# Patient Record
Sex: Male | Born: 1940 | Race: White | Hispanic: No | Marital: Married
Health system: Southern US, Community
[De-identification: ages and names within clinical notes are randomized; demographics above are authoritative.]

---

## 1997-12-05 ENCOUNTER — Ambulatory Visit (HOSPITAL_BASED_OUTPATIENT_CLINIC_OR_DEPARTMENT_OTHER): Admission: RE | Admit: 1997-12-05 | Discharge: 1997-12-05 | Payer: Self-pay | Admitting: Surgery

## 2012-01-04 DIAGNOSIS — Z1331 Encounter for screening for depression: Secondary | ICD-10-CM | POA: Diagnosis not present

## 2012-01-04 DIAGNOSIS — E7800 Pure hypercholesterolemia, unspecified: Secondary | ICD-10-CM | POA: Diagnosis not present

## 2012-01-04 DIAGNOSIS — I10 Essential (primary) hypertension: Secondary | ICD-10-CM | POA: Diagnosis not present

## 2012-05-22 DIAGNOSIS — Z23 Encounter for immunization: Secondary | ICD-10-CM | POA: Diagnosis not present

## 2012-07-17 DIAGNOSIS — H2510 Age-related nuclear cataract, unspecified eye: Secondary | ICD-10-CM | POA: Diagnosis not present

## 2012-07-17 DIAGNOSIS — H04129 Dry eye syndrome of unspecified lacrimal gland: Secondary | ICD-10-CM | POA: Diagnosis not present

## 2012-07-17 DIAGNOSIS — H43819 Vitreous degeneration, unspecified eye: Secondary | ICD-10-CM | POA: Diagnosis not present

## 2012-07-17 DIAGNOSIS — H52209 Unspecified astigmatism, unspecified eye: Secondary | ICD-10-CM | POA: Diagnosis not present

## 2012-12-19 DIAGNOSIS — E7800 Pure hypercholesterolemia, unspecified: Secondary | ICD-10-CM | POA: Diagnosis not present

## 2012-12-19 DIAGNOSIS — Z1211 Encounter for screening for malignant neoplasm of colon: Secondary | ICD-10-CM | POA: Diagnosis not present

## 2012-12-19 DIAGNOSIS — I10 Essential (primary) hypertension: Secondary | ICD-10-CM | POA: Diagnosis not present

## 2013-12-25 DIAGNOSIS — I10 Essential (primary) hypertension: Secondary | ICD-10-CM | POA: Diagnosis not present

## 2013-12-25 DIAGNOSIS — Z1331 Encounter for screening for depression: Secondary | ICD-10-CM | POA: Diagnosis not present

## 2013-12-25 DIAGNOSIS — Z23 Encounter for immunization: Secondary | ICD-10-CM | POA: Diagnosis not present

## 2013-12-25 DIAGNOSIS — E7800 Pure hypercholesterolemia, unspecified: Secondary | ICD-10-CM | POA: Diagnosis not present

## 2014-02-24 DIAGNOSIS — K047 Periapical abscess without sinus: Secondary | ICD-10-CM | POA: Diagnosis not present

## 2014-02-24 DIAGNOSIS — R6884 Jaw pain: Secondary | ICD-10-CM | POA: Diagnosis not present

## 2014-07-07 DIAGNOSIS — R399 Unspecified symptoms and signs involving the genitourinary system: Secondary | ICD-10-CM | POA: Diagnosis not present

## 2014-07-07 DIAGNOSIS — Z23 Encounter for immunization: Secondary | ICD-10-CM | POA: Diagnosis not present

## 2014-07-07 DIAGNOSIS — N400 Enlarged prostate without lower urinary tract symptoms: Secondary | ICD-10-CM | POA: Diagnosis not present

## 2014-07-23 DIAGNOSIS — R300 Dysuria: Secondary | ICD-10-CM | POA: Diagnosis not present

## 2014-07-23 DIAGNOSIS — N390 Urinary tract infection, site not specified: Secondary | ICD-10-CM | POA: Diagnosis not present

## 2014-08-08 DIAGNOSIS — N400 Enlarged prostate without lower urinary tract symptoms: Secondary | ICD-10-CM | POA: Diagnosis not present

## 2014-08-08 DIAGNOSIS — I10 Essential (primary) hypertension: Secondary | ICD-10-CM | POA: Diagnosis not present

## 2014-09-16 DIAGNOSIS — N390 Urinary tract infection, site not specified: Secondary | ICD-10-CM | POA: Diagnosis not present

## 2014-09-16 DIAGNOSIS — R339 Retention of urine, unspecified: Secondary | ICD-10-CM | POA: Diagnosis not present

## 2014-09-16 DIAGNOSIS — N401 Enlarged prostate with lower urinary tract symptoms: Secondary | ICD-10-CM | POA: Diagnosis not present

## 2014-09-16 DIAGNOSIS — R351 Nocturia: Secondary | ICD-10-CM | POA: Diagnosis not present

## 2014-09-16 DIAGNOSIS — R3915 Urgency of urination: Secondary | ICD-10-CM | POA: Diagnosis not present

## 2014-09-24 DIAGNOSIS — R339 Retention of urine, unspecified: Secondary | ICD-10-CM | POA: Diagnosis not present

## 2014-10-16 DIAGNOSIS — M25562 Pain in left knee: Secondary | ICD-10-CM | POA: Diagnosis not present

## 2014-10-22 DIAGNOSIS — R339 Retention of urine, unspecified: Secondary | ICD-10-CM | POA: Diagnosis not present

## 2014-11-06 DIAGNOSIS — R339 Retention of urine, unspecified: Secondary | ICD-10-CM | POA: Diagnosis not present

## 2014-11-13 DIAGNOSIS — N3281 Overactive bladder: Secondary | ICD-10-CM | POA: Diagnosis not present

## 2014-11-13 DIAGNOSIS — R339 Retention of urine, unspecified: Secondary | ICD-10-CM | POA: Diagnosis not present

## 2015-04-20 DIAGNOSIS — H5203 Hypermetropia, bilateral: Secondary | ICD-10-CM | POA: Diagnosis not present

## 2015-04-20 DIAGNOSIS — H2513 Age-related nuclear cataract, bilateral: Secondary | ICD-10-CM | POA: Diagnosis not present

## 2015-04-20 DIAGNOSIS — D2311 Other benign neoplasm of skin of right eyelid, including canthus: Secondary | ICD-10-CM | POA: Diagnosis not present

## 2015-04-20 DIAGNOSIS — H02831 Dermatochalasis of right upper eyelid: Secondary | ICD-10-CM | POA: Diagnosis not present

## 2015-05-12 DIAGNOSIS — N401 Enlarged prostate with lower urinary tract symptoms: Secondary | ICD-10-CM | POA: Diagnosis not present

## 2015-05-19 DIAGNOSIS — N401 Enlarged prostate with lower urinary tract symptoms: Secondary | ICD-10-CM | POA: Diagnosis not present

## 2015-05-19 DIAGNOSIS — N138 Other obstructive and reflux uropathy: Secondary | ICD-10-CM | POA: Diagnosis not present

## 2015-05-19 DIAGNOSIS — R972 Elevated prostate specific antigen [PSA]: Secondary | ICD-10-CM | POA: Diagnosis not present

## 2015-08-19 DIAGNOSIS — R972 Elevated prostate specific antigen [PSA]: Secondary | ICD-10-CM | POA: Diagnosis not present

## 2015-08-19 DIAGNOSIS — N401 Enlarged prostate with lower urinary tract symptoms: Secondary | ICD-10-CM | POA: Diagnosis not present

## 2015-08-19 DIAGNOSIS — B9620 Unspecified Escherichia coli [E. coli] as the cause of diseases classified elsewhere: Secondary | ICD-10-CM | POA: Diagnosis not present

## 2015-08-19 DIAGNOSIS — N390 Urinary tract infection, site not specified: Secondary | ICD-10-CM | POA: Diagnosis not present

## 2015-08-19 DIAGNOSIS — Z0000 Encounter for general adult medical examination without abnormal findings: Secondary | ICD-10-CM | POA: Diagnosis not present

## 2015-11-17 DIAGNOSIS — R972 Elevated prostate specific antigen [PSA]: Secondary | ICD-10-CM | POA: Diagnosis not present

## 2016-02-16 DIAGNOSIS — B9620 Unspecified Escherichia coli [E. coli] as the cause of diseases classified elsewhere: Secondary | ICD-10-CM | POA: Diagnosis not present

## 2016-02-16 DIAGNOSIS — R310 Gross hematuria: Secondary | ICD-10-CM | POA: Diagnosis not present

## 2016-02-16 DIAGNOSIS — N390 Urinary tract infection, site not specified: Secondary | ICD-10-CM | POA: Diagnosis not present

## 2016-05-10 DIAGNOSIS — R339 Retention of urine, unspecified: Secondary | ICD-10-CM | POA: Diagnosis not present

## 2016-05-24 DIAGNOSIS — N400 Enlarged prostate without lower urinary tract symptoms: Secondary | ICD-10-CM | POA: Diagnosis not present

## 2016-05-24 DIAGNOSIS — N5201 Erectile dysfunction due to arterial insufficiency: Secondary | ICD-10-CM | POA: Diagnosis not present

## 2016-05-24 DIAGNOSIS — R338 Other retention of urine: Secondary | ICD-10-CM | POA: Diagnosis not present

## 2016-05-24 DIAGNOSIS — R972 Elevated prostate specific antigen [PSA]: Secondary | ICD-10-CM | POA: Diagnosis not present

## 2016-07-19 DIAGNOSIS — H524 Presbyopia: Secondary | ICD-10-CM | POA: Diagnosis not present

## 2016-07-19 DIAGNOSIS — H2513 Age-related nuclear cataract, bilateral: Secondary | ICD-10-CM | POA: Diagnosis not present

## 2016-07-19 DIAGNOSIS — H43813 Vitreous degeneration, bilateral: Secondary | ICD-10-CM | POA: Diagnosis not present

## 2016-07-19 DIAGNOSIS — H01001 Unspecified blepharitis right upper eyelid: Secondary | ICD-10-CM | POA: Diagnosis not present

## 2016-08-16 DIAGNOSIS — R972 Elevated prostate specific antigen [PSA]: Secondary | ICD-10-CM | POA: Diagnosis not present

## 2016-09-30 DIAGNOSIS — Z1389 Encounter for screening for other disorder: Secondary | ICD-10-CM | POA: Diagnosis not present

## 2016-09-30 DIAGNOSIS — Z136 Encounter for screening for cardiovascular disorders: Secondary | ICD-10-CM | POA: Diagnosis not present

## 2016-09-30 DIAGNOSIS — E7800 Pure hypercholesterolemia, unspecified: Secondary | ICD-10-CM | POA: Diagnosis not present

## 2016-09-30 DIAGNOSIS — Z125 Encounter for screening for malignant neoplasm of prostate: Secondary | ICD-10-CM | POA: Diagnosis not present

## 2016-09-30 DIAGNOSIS — R339 Retention of urine, unspecified: Secondary | ICD-10-CM | POA: Diagnosis not present

## 2016-09-30 DIAGNOSIS — N400 Enlarged prostate without lower urinary tract symptoms: Secondary | ICD-10-CM | POA: Diagnosis not present

## 2016-09-30 DIAGNOSIS — Z0000 Encounter for general adult medical examination without abnormal findings: Secondary | ICD-10-CM | POA: Diagnosis not present

## 2016-09-30 DIAGNOSIS — Z1211 Encounter for screening for malignant neoplasm of colon: Secondary | ICD-10-CM | POA: Diagnosis not present

## 2016-09-30 DIAGNOSIS — Z23 Encounter for immunization: Secondary | ICD-10-CM | POA: Diagnosis not present

## 2016-09-30 DIAGNOSIS — I10 Essential (primary) hypertension: Secondary | ICD-10-CM | POA: Diagnosis not present

## 2016-10-05 ENCOUNTER — Other Ambulatory Visit: Payer: Self-pay | Admitting: Family Medicine

## 2016-10-05 DIAGNOSIS — Z136 Encounter for screening for cardiovascular disorders: Secondary | ICD-10-CM

## 2016-10-27 DIAGNOSIS — R972 Elevated prostate specific antigen [PSA]: Secondary | ICD-10-CM | POA: Diagnosis not present

## 2016-11-03 DIAGNOSIS — R338 Other retention of urine: Secondary | ICD-10-CM | POA: Diagnosis not present

## 2016-11-03 DIAGNOSIS — R972 Elevated prostate specific antigen [PSA]: Secondary | ICD-10-CM | POA: Diagnosis not present

## 2016-11-17 DIAGNOSIS — E7800 Pure hypercholesterolemia, unspecified: Secondary | ICD-10-CM | POA: Diagnosis not present

## 2017-01-27 DIAGNOSIS — Z1211 Encounter for screening for malignant neoplasm of colon: Secondary | ICD-10-CM | POA: Diagnosis not present

## 2017-02-07 DIAGNOSIS — R972 Elevated prostate specific antigen [PSA]: Secondary | ICD-10-CM | POA: Diagnosis not present

## 2017-03-30 DIAGNOSIS — H0011 Chalazion right upper eyelid: Secondary | ICD-10-CM | POA: Diagnosis not present

## 2017-03-30 DIAGNOSIS — H0015 Chalazion left lower eyelid: Secondary | ICD-10-CM | POA: Diagnosis not present

## 2017-03-30 DIAGNOSIS — H0012 Chalazion right lower eyelid: Secondary | ICD-10-CM | POA: Diagnosis not present

## 2017-05-09 DIAGNOSIS — R972 Elevated prostate specific antigen [PSA]: Secondary | ICD-10-CM | POA: Diagnosis not present

## 2017-05-18 DIAGNOSIS — R972 Elevated prostate specific antigen [PSA]: Secondary | ICD-10-CM | POA: Diagnosis not present

## 2017-05-18 DIAGNOSIS — R338 Other retention of urine: Secondary | ICD-10-CM | POA: Diagnosis not present

## 2017-06-06 DIAGNOSIS — Z23 Encounter for immunization: Secondary | ICD-10-CM | POA: Diagnosis not present

## 2017-08-24 DIAGNOSIS — R972 Elevated prostate specific antigen [PSA]: Secondary | ICD-10-CM | POA: Diagnosis not present

## 2017-09-13 ENCOUNTER — Other Ambulatory Visit: Payer: Self-pay | Admitting: Family Medicine

## 2017-09-13 DIAGNOSIS — Z136 Encounter for screening for cardiovascular disorders: Secondary | ICD-10-CM

## 2017-09-25 ENCOUNTER — Ambulatory Visit
Admission: RE | Admit: 2017-09-25 | Discharge: 2017-09-25 | Disposition: A | Discharge status: Home / Self Care | Payer: Medicare Other | Source: Ambulatory Visit | Attending: Family Medicine | Admitting: Family Medicine

## 2017-09-25 DIAGNOSIS — Z136 Encounter for screening for cardiovascular disorders: Secondary | ICD-10-CM

## 2017-09-25 DIAGNOSIS — Z87891 Personal history of nicotine dependence: Secondary | ICD-10-CM | POA: Diagnosis not present

## 2017-10-17 DIAGNOSIS — R972 Elevated prostate specific antigen [PSA]: Secondary | ICD-10-CM | POA: Diagnosis not present

## 2017-10-20 DIAGNOSIS — N3000 Acute cystitis without hematuria: Secondary | ICD-10-CM | POA: Diagnosis not present

## 2017-11-15 DIAGNOSIS — R972 Elevated prostate specific antigen [PSA]: Secondary | ICD-10-CM | POA: Diagnosis not present

## 2017-11-15 DIAGNOSIS — N401 Enlarged prostate with lower urinary tract symptoms: Secondary | ICD-10-CM | POA: Diagnosis not present

## 2017-11-15 DIAGNOSIS — R338 Other retention of urine: Secondary | ICD-10-CM | POA: Diagnosis not present

## 2018-02-06 DIAGNOSIS — I10 Essential (primary) hypertension: Secondary | ICD-10-CM | POA: Diagnosis not present

## 2018-02-06 DIAGNOSIS — N400 Enlarged prostate without lower urinary tract symptoms: Secondary | ICD-10-CM | POA: Diagnosis not present

## 2018-02-06 DIAGNOSIS — Z1389 Encounter for screening for other disorder: Secondary | ICD-10-CM | POA: Diagnosis not present

## 2018-02-06 DIAGNOSIS — E7800 Pure hypercholesterolemia, unspecified: Secondary | ICD-10-CM | POA: Diagnosis not present

## 2018-02-14 DIAGNOSIS — R972 Elevated prostate specific antigen [PSA]: Secondary | ICD-10-CM | POA: Diagnosis not present

## 2018-03-13 DIAGNOSIS — I10 Essential (primary) hypertension: Secondary | ICD-10-CM | POA: Diagnosis not present

## 2018-05-08 DIAGNOSIS — R972 Elevated prostate specific antigen [PSA]: Secondary | ICD-10-CM | POA: Diagnosis not present

## 2018-05-15 DIAGNOSIS — R972 Elevated prostate specific antigen [PSA]: Secondary | ICD-10-CM | POA: Diagnosis not present

## 2018-05-15 DIAGNOSIS — R338 Other retention of urine: Secondary | ICD-10-CM | POA: Diagnosis not present

## 2018-05-19 IMAGING — US US ABDOMINAL AORTA SCREENING AAA
1 series · 14 of 21 positions shown · non-contrast
Comparison: None.

CLINICAL DATA: 76-year-old male former smoker.  Initial encounter.

EXAM:
ULTRASOUND OF ABDOMINAL AORTA
TECHNIQUE: Ultrasound examination of the abdominal aorta was performed to
evaluate for abdominal aortic aneurysm.

[Series 1: us abdominal aorta screening aaa · 0.34mm/px · 14 of 21 slices shown]
[im 1/21]
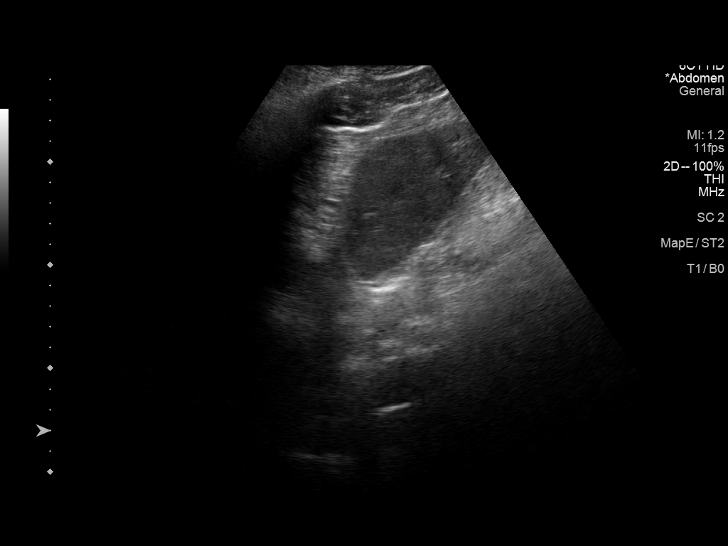
[im 3/21]
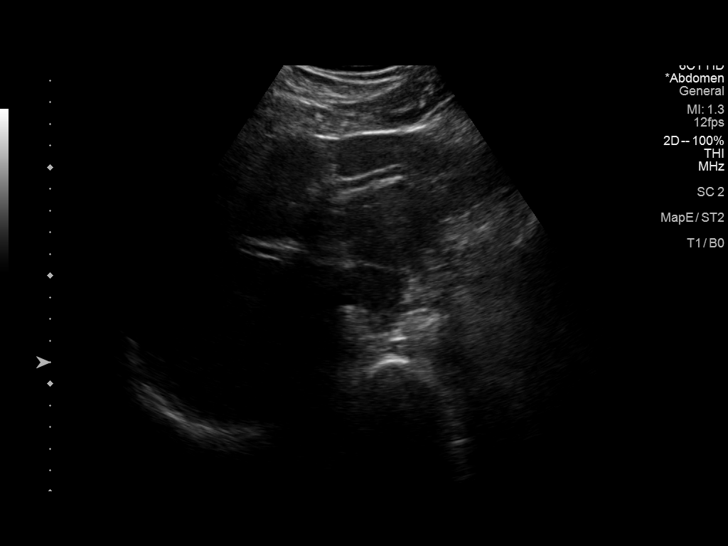
[im 4/21]
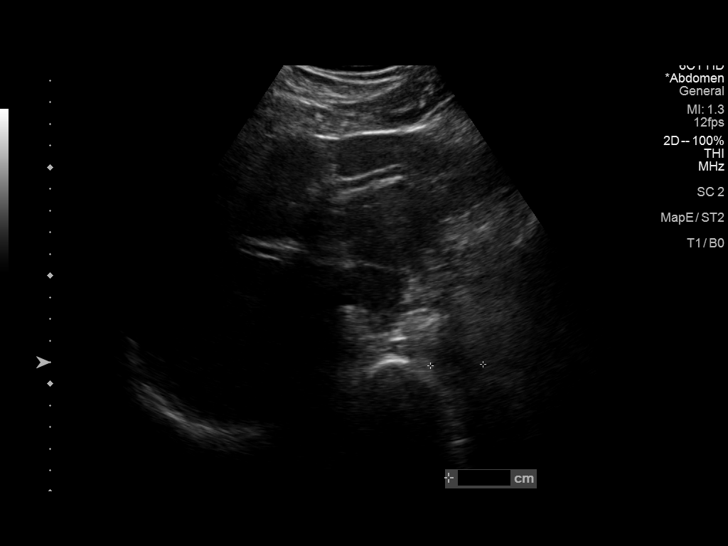
[im 6/21]
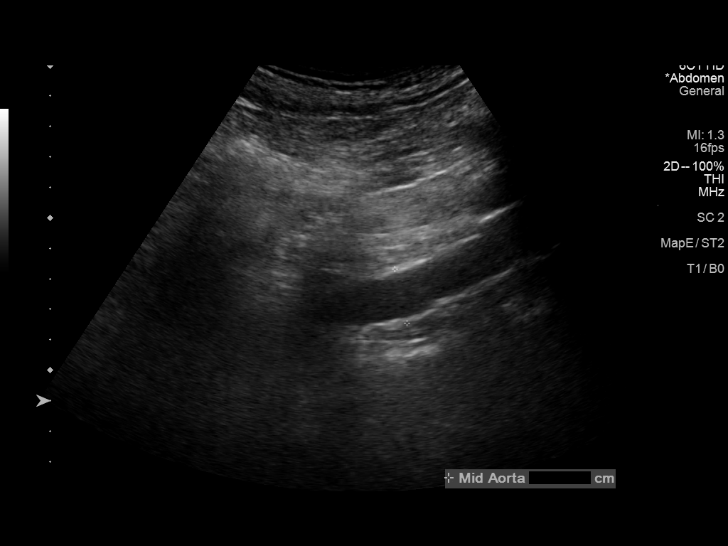
[im 7/21]
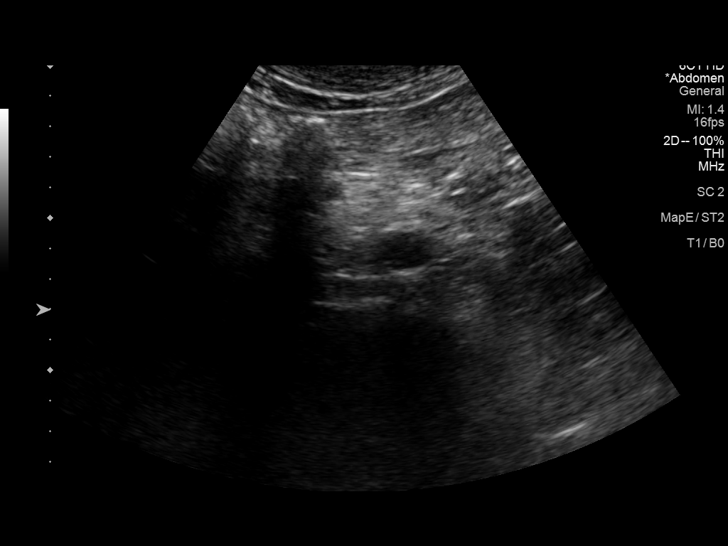
[im 9/21]
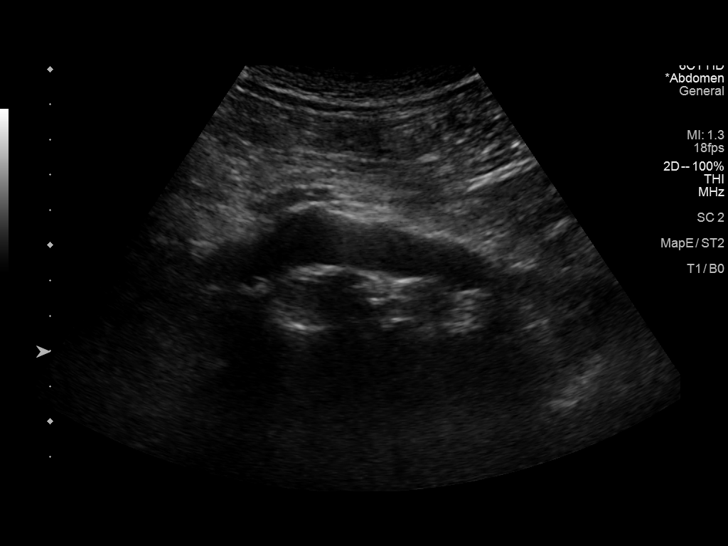
[im 10/21]
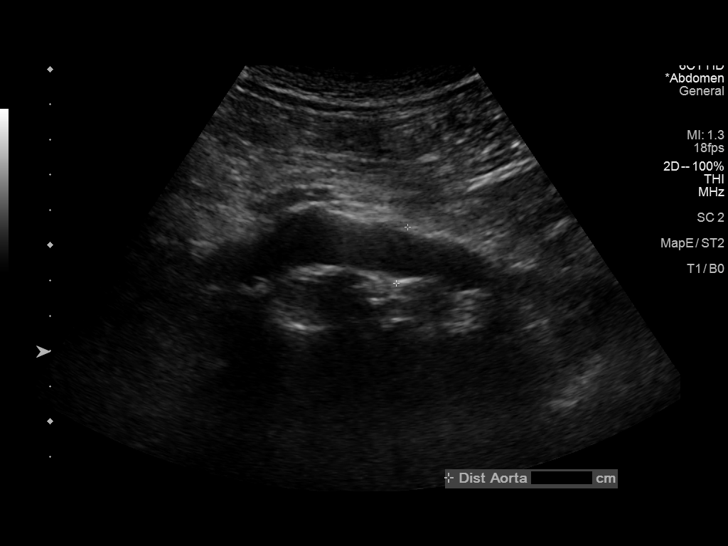
[im 12/21]
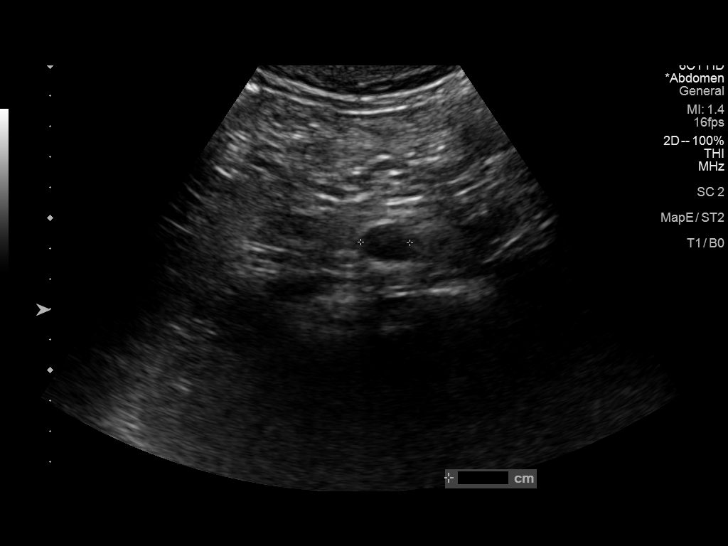
[im 13/21]
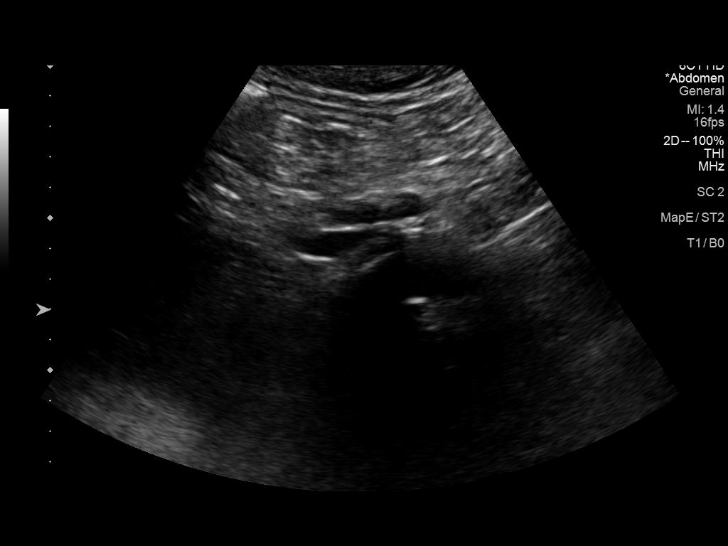
[im 15/21]
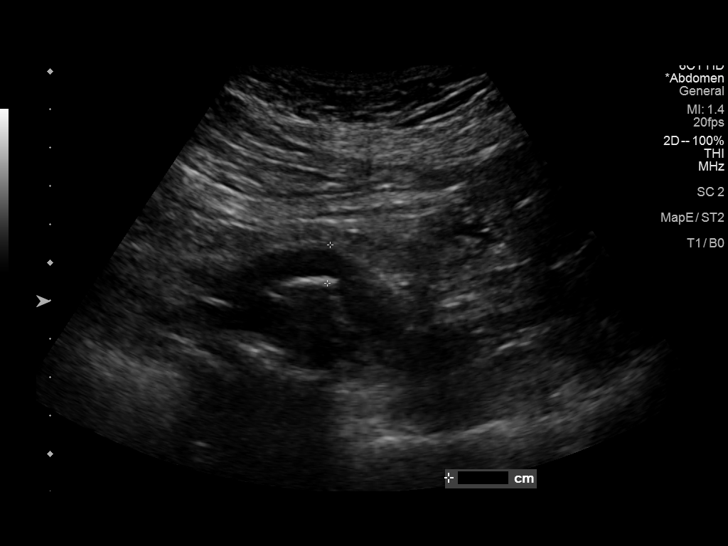
[im 16/21]
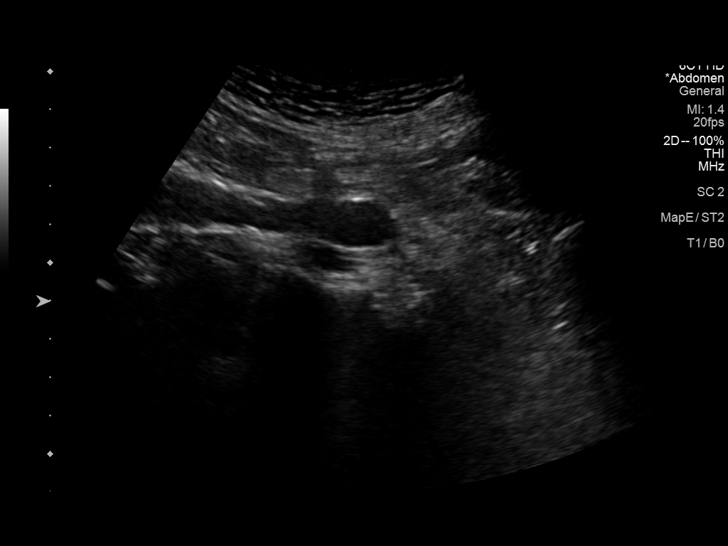
[im 18/21]
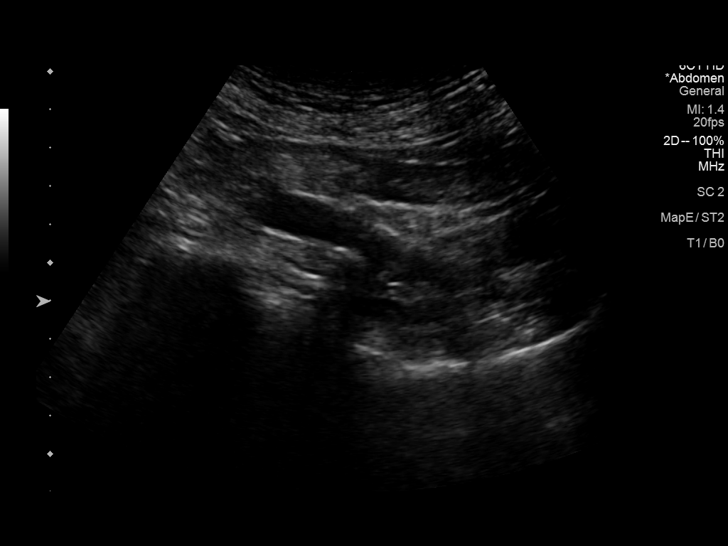
[im 19/21]
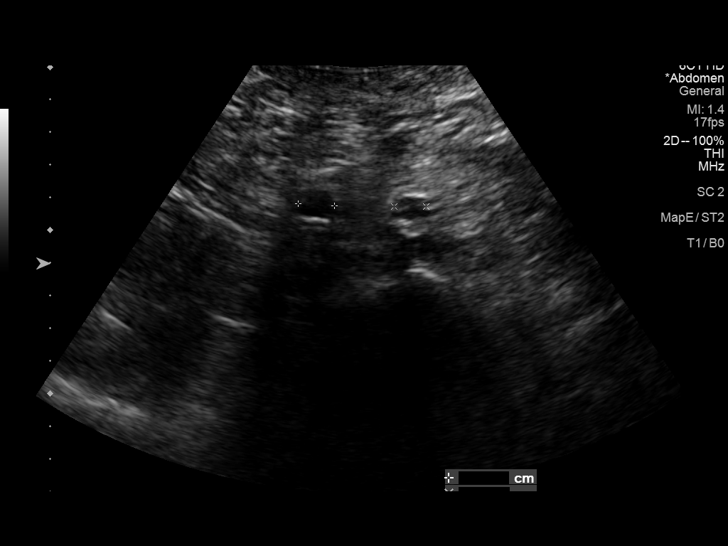
[im 21/21]
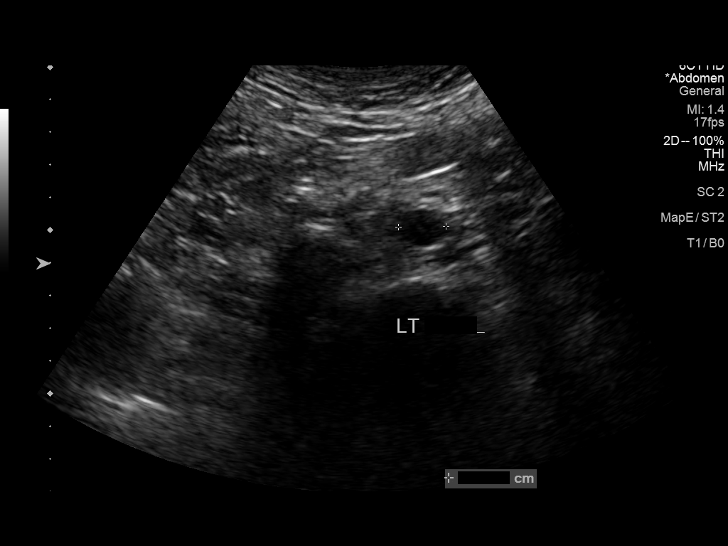

[14 of 21 positions shown; findings below may reference images not displayed]

FINDINGS: Abdominal aortic measurements as follows:

Proximal:  2.6 x 2.4 cm.  Evaluation slightly limited by bowel gas.

Mid:  1.8 x 1.9 cm cm

Distal:  1.6 x 1.6 cm cm

Left common iliac artery mild bulge measures 1.4 x 1.5 cm versus
right common iliac artery which measures 1 x 1.1 cm.
IMPRESSION: Evaluation of proximal aorta is slightly limited by bowel gas. No
abdominal aortic aneurysm detected. Atherosclerotic changes noted.

Left common iliac artery mild bulge measures 1.4 x 1.5 cm versus
right common iliac artery which measures 1 x 1.1 cm.

Aortic Atherosclerosis ([id number]).

## 2018-08-31 DIAGNOSIS — N3000 Acute cystitis without hematuria: Secondary | ICD-10-CM | POA: Diagnosis not present

## 2018-09-11 DIAGNOSIS — I10 Essential (primary) hypertension: Secondary | ICD-10-CM | POA: Diagnosis not present

## 2018-09-11 DIAGNOSIS — N400 Enlarged prostate without lower urinary tract symptoms: Secondary | ICD-10-CM | POA: Diagnosis not present

## 2018-09-11 DIAGNOSIS — R0600 Dyspnea, unspecified: Secondary | ICD-10-CM | POA: Diagnosis not present

## 2018-09-11 DIAGNOSIS — Z23 Encounter for immunization: Secondary | ICD-10-CM | POA: Diagnosis not present

## 2018-09-11 DIAGNOSIS — Z0000 Encounter for general adult medical examination without abnormal findings: Secondary | ICD-10-CM | POA: Diagnosis not present

## 2018-09-11 DIAGNOSIS — E7800 Pure hypercholesterolemia, unspecified: Secondary | ICD-10-CM | POA: Diagnosis not present

## 2018-09-11 DIAGNOSIS — R7309 Other abnormal glucose: Secondary | ICD-10-CM | POA: Diagnosis not present

## 2018-09-11 DIAGNOSIS — Z1389 Encounter for screening for other disorder: Secondary | ICD-10-CM | POA: Diagnosis not present

## 2018-11-16 DIAGNOSIS — R338 Other retention of urine: Secondary | ICD-10-CM | POA: Diagnosis not present

## 2018-11-16 DIAGNOSIS — R8279 Other abnormal findings on microbiological examination of urine: Secondary | ICD-10-CM | POA: Diagnosis not present

## 2019-02-11 DIAGNOSIS — R972 Elevated prostate specific antigen [PSA]: Secondary | ICD-10-CM | POA: Diagnosis not present

## 2019-02-18 DIAGNOSIS — N401 Enlarged prostate with lower urinary tract symptoms: Secondary | ICD-10-CM | POA: Diagnosis not present

## 2019-02-18 DIAGNOSIS — R338 Other retention of urine: Secondary | ICD-10-CM | POA: Diagnosis not present

## 2019-02-18 DIAGNOSIS — R972 Elevated prostate specific antigen [PSA]: Secondary | ICD-10-CM | POA: Diagnosis not present

## 2019-03-08 DIAGNOSIS — R7303 Prediabetes: Secondary | ICD-10-CM | POA: Diagnosis not present

## 2019-03-08 DIAGNOSIS — I10 Essential (primary) hypertension: Secondary | ICD-10-CM | POA: Diagnosis not present

## 2019-03-08 DIAGNOSIS — N400 Enlarged prostate without lower urinary tract symptoms: Secondary | ICD-10-CM | POA: Diagnosis not present

## 2019-03-08 DIAGNOSIS — E7800 Pure hypercholesterolemia, unspecified: Secondary | ICD-10-CM | POA: Diagnosis not present

## 2019-04-02 DIAGNOSIS — E7800 Pure hypercholesterolemia, unspecified: Secondary | ICD-10-CM | POA: Diagnosis not present

## 2019-04-02 DIAGNOSIS — R7303 Prediabetes: Secondary | ICD-10-CM | POA: Diagnosis not present

## 2019-08-19 DIAGNOSIS — R972 Elevated prostate specific antigen [PSA]: Secondary | ICD-10-CM | POA: Diagnosis not present

## 2019-09-20 DIAGNOSIS — I10 Essential (primary) hypertension: Secondary | ICD-10-CM | POA: Diagnosis not present

## 2019-09-20 DIAGNOSIS — Z1389 Encounter for screening for other disorder: Secondary | ICD-10-CM | POA: Diagnosis not present

## 2019-09-20 DIAGNOSIS — Z0000 Encounter for general adult medical examination without abnormal findings: Secondary | ICD-10-CM | POA: Diagnosis not present

## 2019-09-20 DIAGNOSIS — N400 Enlarged prostate without lower urinary tract symptoms: Secondary | ICD-10-CM | POA: Diagnosis not present

## 2019-09-20 DIAGNOSIS — R7303 Prediabetes: Secondary | ICD-10-CM | POA: Diagnosis not present

## 2019-09-20 DIAGNOSIS — E7800 Pure hypercholesterolemia, unspecified: Secondary | ICD-10-CM | POA: Diagnosis not present

## 2020-03-19 DIAGNOSIS — N400 Enlarged prostate without lower urinary tract symptoms: Secondary | ICD-10-CM | POA: Diagnosis not present

## 2020-03-19 DIAGNOSIS — E7800 Pure hypercholesterolemia, unspecified: Secondary | ICD-10-CM | POA: Diagnosis not present

## 2020-03-19 DIAGNOSIS — R7303 Prediabetes: Secondary | ICD-10-CM | POA: Diagnosis not present

## 2020-03-19 DIAGNOSIS — I10 Essential (primary) hypertension: Secondary | ICD-10-CM | POA: Diagnosis not present

## 2020-06-16 DIAGNOSIS — H524 Presbyopia: Secondary | ICD-10-CM | POA: Diagnosis not present

## 2020-06-16 DIAGNOSIS — H2513 Age-related nuclear cataract, bilateral: Secondary | ICD-10-CM | POA: Diagnosis not present

## 2020-06-16 DIAGNOSIS — H43813 Vitreous degeneration, bilateral: Secondary | ICD-10-CM | POA: Diagnosis not present

## 2020-09-24 DIAGNOSIS — Z0000 Encounter for general adult medical examination without abnormal findings: Secondary | ICD-10-CM | POA: Diagnosis not present

## 2020-09-24 DIAGNOSIS — N400 Enlarged prostate without lower urinary tract symptoms: Secondary | ICD-10-CM | POA: Diagnosis not present

## 2020-09-24 DIAGNOSIS — R7303 Prediabetes: Secondary | ICD-10-CM | POA: Diagnosis not present

## 2020-09-24 DIAGNOSIS — Z1389 Encounter for screening for other disorder: Secondary | ICD-10-CM | POA: Diagnosis not present

## 2020-09-24 DIAGNOSIS — E7800 Pure hypercholesterolemia, unspecified: Secondary | ICD-10-CM | POA: Diagnosis not present

## 2020-09-24 DIAGNOSIS — Z23 Encounter for immunization: Secondary | ICD-10-CM | POA: Diagnosis not present

## 2020-09-24 DIAGNOSIS — R7309 Other abnormal glucose: Secondary | ICD-10-CM | POA: Diagnosis not present

## 2020-09-24 DIAGNOSIS — I10 Essential (primary) hypertension: Secondary | ICD-10-CM | POA: Diagnosis not present

## 2020-10-01 DIAGNOSIS — R972 Elevated prostate specific antigen [PSA]: Secondary | ICD-10-CM | POA: Diagnosis not present

## 2020-10-01 DIAGNOSIS — R338 Other retention of urine: Secondary | ICD-10-CM | POA: Diagnosis not present

## 2020-10-01 DIAGNOSIS — N312 Flaccid neuropathic bladder, not elsewhere classified: Secondary | ICD-10-CM | POA: Diagnosis not present

## 2021-03-25 DIAGNOSIS — R7303 Prediabetes: Secondary | ICD-10-CM | POA: Diagnosis not present

## 2021-03-25 DIAGNOSIS — D692 Other nonthrombocytopenic purpura: Secondary | ICD-10-CM | POA: Diagnosis not present

## 2021-03-25 DIAGNOSIS — E7800 Pure hypercholesterolemia, unspecified: Secondary | ICD-10-CM | POA: Diagnosis not present

## 2021-03-25 DIAGNOSIS — N400 Enlarged prostate without lower urinary tract symptoms: Secondary | ICD-10-CM | POA: Diagnosis not present

## 2021-03-25 DIAGNOSIS — I10 Essential (primary) hypertension: Secondary | ICD-10-CM | POA: Diagnosis not present

## 2021-03-25 DIAGNOSIS — F439 Reaction to severe stress, unspecified: Secondary | ICD-10-CM | POA: Diagnosis not present

## 2021-04-06 DIAGNOSIS — R972 Elevated prostate specific antigen [PSA]: Secondary | ICD-10-CM | POA: Diagnosis not present

## 2021-06-14 ENCOUNTER — Ambulatory Visit: Payer: Medicare Other | Attending: Internal Medicine

## 2021-06-14 DIAGNOSIS — Z23 Encounter for immunization: Secondary | ICD-10-CM

## 2021-06-14 NOTE — Progress Notes (Signed)
   [facility]ov[facility] Vacc[facility]  Name:  Rodney Cox    MRN: [id number] DOB: 22-Jan-1941  06/14/2021  [name] was observed post Covid-19 immunization for 15 minutes without incident. He was provided with Vaccine Information Sheet and instruction to access the V-Safe system.   [name] was instructed to call [phone] with any severe reactions post vaccine: Difficulty breathing  Swelling of face and throat  A fast heartbeat  A bad rash all over body  Dizziness and weakness   Immunizations Administered     Name Date Dose VIS Date Route   Pfizer Covid-19 Vaccine Bivalent Booster 06/14/2021 10:55 AM 0.3 mL 03/31/2021 Intramuscular   Manufacturer: [place]   Lot: [id number]   [place]: [phone]

## 2021-06-30 DIAGNOSIS — H25011 Cortical age-related cataract, right eye: Secondary | ICD-10-CM | POA: Diagnosis not present

## 2021-06-30 DIAGNOSIS — H5203 Hypermetropia, bilateral: Secondary | ICD-10-CM | POA: Diagnosis not present

## 2021-06-30 DIAGNOSIS — H2513 Age-related nuclear cataract, bilateral: Secondary | ICD-10-CM | POA: Diagnosis not present

## 2021-06-30 DIAGNOSIS — H43813 Vitreous degeneration, bilateral: Secondary | ICD-10-CM | POA: Diagnosis not present

## 2021-07-02 ENCOUNTER — Other Ambulatory Visit (HOSPITAL_BASED_OUTPATIENT_CLINIC_OR_DEPARTMENT_OTHER): Payer: Self-pay

## 2021-07-02 MED ORDER — PFIZER COVID-19 VAC BIVALENT 30 MCG/0.3ML IM SUSP
INTRAMUSCULAR | 0 refills | Status: AC
  Filled 2021-07-02: qty 0.3, 1d supply, fill #0

## 2021-07-30 DIAGNOSIS — R051 Acute cough: Secondary | ICD-10-CM | POA: Diagnosis not present

## 2021-07-30 DIAGNOSIS — J208 Acute bronchitis due to other specified organisms: Secondary | ICD-10-CM | POA: Diagnosis not present

## 2021-09-27 DIAGNOSIS — R972 Elevated prostate specific antigen [PSA]: Secondary | ICD-10-CM | POA: Diagnosis not present

## 2021-10-04 DIAGNOSIS — R972 Elevated prostate specific antigen [PSA]: Secondary | ICD-10-CM | POA: Diagnosis not present

## 2021-10-04 DIAGNOSIS — N312 Flaccid neuropathic bladder, not elsewhere classified: Secondary | ICD-10-CM | POA: Diagnosis not present

## 2021-10-21 DIAGNOSIS — F439 Reaction to severe stress, unspecified: Secondary | ICD-10-CM | POA: Diagnosis not present

## 2021-10-21 DIAGNOSIS — E7800 Pure hypercholesterolemia, unspecified: Secondary | ICD-10-CM | POA: Diagnosis not present

## 2021-10-21 DIAGNOSIS — I700 Atherosclerosis of aorta: Secondary | ICD-10-CM | POA: Diagnosis not present

## 2021-10-21 DIAGNOSIS — R972 Elevated prostate specific antigen [PSA]: Secondary | ICD-10-CM | POA: Diagnosis not present

## 2021-10-21 DIAGNOSIS — D692 Other nonthrombocytopenic purpura: Secondary | ICD-10-CM | POA: Diagnosis not present

## 2021-10-21 DIAGNOSIS — I10 Essential (primary) hypertension: Secondary | ICD-10-CM | POA: Diagnosis not present

## 2021-10-21 DIAGNOSIS — Z0000 Encounter for general adult medical examination without abnormal findings: Secondary | ICD-10-CM | POA: Diagnosis not present

## 2021-10-21 DIAGNOSIS — N400 Enlarged prostate without lower urinary tract symptoms: Secondary | ICD-10-CM | POA: Diagnosis not present

## 2021-10-21 DIAGNOSIS — Z1389 Encounter for screening for other disorder: Secondary | ICD-10-CM | POA: Diagnosis not present

## 2021-10-21 DIAGNOSIS — R7303 Prediabetes: Secondary | ICD-10-CM | POA: Diagnosis not present

## 2022-02-22 DIAGNOSIS — D485 Neoplasm of uncertain behavior of skin: Secondary | ICD-10-CM | POA: Diagnosis not present

## 2022-02-22 DIAGNOSIS — C44319 Basal cell carcinoma of skin of other parts of face: Secondary | ICD-10-CM | POA: Diagnosis not present

## 2022-03-14 DIAGNOSIS — C44319 Basal cell carcinoma of skin of other parts of face: Secondary | ICD-10-CM | POA: Diagnosis not present

## 2022-04-19 DIAGNOSIS — I10 Essential (primary) hypertension: Secondary | ICD-10-CM | POA: Diagnosis not present

## 2022-04-19 DIAGNOSIS — F439 Reaction to severe stress, unspecified: Secondary | ICD-10-CM | POA: Diagnosis not present

## 2022-04-19 DIAGNOSIS — E7800 Pure hypercholesterolemia, unspecified: Secondary | ICD-10-CM | POA: Diagnosis not present

## 2022-04-19 DIAGNOSIS — R7303 Prediabetes: Secondary | ICD-10-CM | POA: Diagnosis not present

## 2022-04-19 DIAGNOSIS — N400 Enlarged prostate without lower urinary tract symptoms: Secondary | ICD-10-CM | POA: Diagnosis not present

## 2022-04-21 DIAGNOSIS — C44319 Basal cell carcinoma of skin of other parts of face: Secondary | ICD-10-CM | POA: Diagnosis not present

## 2022-05-13 DIAGNOSIS — Z23 Encounter for immunization: Secondary | ICD-10-CM | POA: Diagnosis not present

## 2022-07-06 DIAGNOSIS — H2513 Age-related nuclear cataract, bilateral: Secondary | ICD-10-CM | POA: Diagnosis not present

## 2022-07-06 DIAGNOSIS — H43813 Vitreous degeneration, bilateral: Secondary | ICD-10-CM | POA: Diagnosis not present

## 2022-07-06 DIAGNOSIS — H5203 Hypermetropia, bilateral: Secondary | ICD-10-CM | POA: Diagnosis not present

## 2022-07-06 DIAGNOSIS — H04123 Dry eye syndrome of bilateral lacrimal glands: Secondary | ICD-10-CM | POA: Diagnosis not present

## 2022-07-06 DIAGNOSIS — H524 Presbyopia: Secondary | ICD-10-CM | POA: Diagnosis not present

## 2022-10-04 DIAGNOSIS — R972 Elevated prostate specific antigen [PSA]: Secondary | ICD-10-CM | POA: Diagnosis not present

## 2022-10-10 DIAGNOSIS — R972 Elevated prostate specific antigen [PSA]: Secondary | ICD-10-CM | POA: Diagnosis not present

## 2022-10-10 DIAGNOSIS — N312 Flaccid neuropathic bladder, not elsewhere classified: Secondary | ICD-10-CM | POA: Diagnosis not present

## 2022-11-10 DIAGNOSIS — Z23 Encounter for immunization: Secondary | ICD-10-CM | POA: Diagnosis not present

## 2022-11-10 DIAGNOSIS — Z1331 Encounter for screening for depression: Secondary | ICD-10-CM | POA: Diagnosis not present

## 2022-11-10 DIAGNOSIS — I10 Essential (primary) hypertension: Secondary | ICD-10-CM | POA: Diagnosis not present

## 2022-11-10 DIAGNOSIS — R972 Elevated prostate specific antigen [PSA]: Secondary | ICD-10-CM | POA: Diagnosis not present

## 2022-11-10 DIAGNOSIS — F439 Reaction to severe stress, unspecified: Secondary | ICD-10-CM | POA: Diagnosis not present

## 2022-11-10 DIAGNOSIS — R7309 Other abnormal glucose: Secondary | ICD-10-CM | POA: Diagnosis not present

## 2022-11-10 DIAGNOSIS — E7800 Pure hypercholesterolemia, unspecified: Secondary | ICD-10-CM | POA: Diagnosis not present

## 2022-11-10 DIAGNOSIS — D692 Other nonthrombocytopenic purpura: Secondary | ICD-10-CM | POA: Diagnosis not present

## 2022-11-10 DIAGNOSIS — I700 Atherosclerosis of aorta: Secondary | ICD-10-CM | POA: Diagnosis not present

## 2022-11-10 DIAGNOSIS — Z0000 Encounter for general adult medical examination without abnormal findings: Secondary | ICD-10-CM | POA: Diagnosis not present

## 2022-11-10 DIAGNOSIS — N400 Enlarged prostate without lower urinary tract symptoms: Secondary | ICD-10-CM | POA: Diagnosis not present

## 2023-05-12 DIAGNOSIS — F439 Reaction to severe stress, unspecified: Secondary | ICD-10-CM | POA: Diagnosis not present

## 2023-05-12 DIAGNOSIS — R7303 Prediabetes: Secondary | ICD-10-CM | POA: Diagnosis not present

## 2023-05-12 DIAGNOSIS — E7800 Pure hypercholesterolemia, unspecified: Secondary | ICD-10-CM | POA: Diagnosis not present

## 2023-05-12 DIAGNOSIS — I10 Essential (primary) hypertension: Secondary | ICD-10-CM | POA: Diagnosis not present

## 2023-05-12 DIAGNOSIS — N400 Enlarged prostate without lower urinary tract symptoms: Secondary | ICD-10-CM | POA: Diagnosis not present

## 2023-05-12 DIAGNOSIS — Z23 Encounter for immunization: Secondary | ICD-10-CM | POA: Diagnosis not present

## 2023-07-10 DIAGNOSIS — H5203 Hypermetropia, bilateral: Secondary | ICD-10-CM | POA: Diagnosis not present

## 2023-07-10 DIAGNOSIS — H2513 Age-related nuclear cataract, bilateral: Secondary | ICD-10-CM | POA: Diagnosis not present

## 2023-07-10 DIAGNOSIS — H524 Presbyopia: Secondary | ICD-10-CM | POA: Diagnosis not present

## 2023-07-10 DIAGNOSIS — H43813 Vitreous degeneration, bilateral: Secondary | ICD-10-CM | POA: Diagnosis not present

## 2023-07-10 DIAGNOSIS — H52203 Unspecified astigmatism, bilateral: Secondary | ICD-10-CM | POA: Diagnosis not present

## 2023-07-10 DIAGNOSIS — H04123 Dry eye syndrome of bilateral lacrimal glands: Secondary | ICD-10-CM | POA: Diagnosis not present

## 2023-07-10 DIAGNOSIS — H25013 Cortical age-related cataract, bilateral: Secondary | ICD-10-CM | POA: Diagnosis not present

## 2023-10-05 DIAGNOSIS — R972 Elevated prostate specific antigen [PSA]: Secondary | ICD-10-CM | POA: Diagnosis not present

## 2023-10-12 DIAGNOSIS — N312 Flaccid neuropathic bladder, not elsewhere classified: Secondary | ICD-10-CM | POA: Diagnosis not present

## 2023-10-12 DIAGNOSIS — R972 Elevated prostate specific antigen [PSA]: Secondary | ICD-10-CM | POA: Diagnosis not present

## 2023-11-30 DIAGNOSIS — R339 Retention of urine, unspecified: Secondary | ICD-10-CM | POA: Diagnosis not present

## 2023-11-30 DIAGNOSIS — N35919 Unspecified urethral stricture, male, unspecified site: Secondary | ICD-10-CM | POA: Diagnosis not present

## 2023-12-01 DIAGNOSIS — R972 Elevated prostate specific antigen [PSA]: Secondary | ICD-10-CM | POA: Diagnosis not present

## 2023-12-01 DIAGNOSIS — H6120 Impacted cerumen, unspecified ear: Secondary | ICD-10-CM | POA: Diagnosis not present

## 2023-12-01 DIAGNOSIS — Z1331 Encounter for screening for depression: Secondary | ICD-10-CM | POA: Diagnosis not present

## 2023-12-01 DIAGNOSIS — Z23 Encounter for immunization: Secondary | ICD-10-CM | POA: Diagnosis not present

## 2023-12-01 DIAGNOSIS — Z634 Disappearance and death of family member: Secondary | ICD-10-CM | POA: Diagnosis not present

## 2023-12-01 DIAGNOSIS — I10 Essential (primary) hypertension: Secondary | ICD-10-CM | POA: Diagnosis not present

## 2023-12-01 DIAGNOSIS — N400 Enlarged prostate without lower urinary tract symptoms: Secondary | ICD-10-CM | POA: Diagnosis not present

## 2023-12-01 DIAGNOSIS — Z0000 Encounter for general adult medical examination without abnormal findings: Secondary | ICD-10-CM | POA: Diagnosis not present

## 2023-12-01 DIAGNOSIS — E7800 Pure hypercholesterolemia, unspecified: Secondary | ICD-10-CM | POA: Diagnosis not present

## 2023-12-01 DIAGNOSIS — R7303 Prediabetes: Secondary | ICD-10-CM | POA: Diagnosis not present

## 2024-01-05 DIAGNOSIS — R339 Retention of urine, unspecified: Secondary | ICD-10-CM | POA: Diagnosis not present

## 2024-01-05 DIAGNOSIS — N35919 Unspecified urethral stricture, male, unspecified site: Secondary | ICD-10-CM | POA: Diagnosis not present

## 2024-01-26 DIAGNOSIS — R339 Retention of urine, unspecified: Secondary | ICD-10-CM | POA: Diagnosis not present

## 2024-01-26 DIAGNOSIS — N35919 Unspecified urethral stricture, male, unspecified site: Secondary | ICD-10-CM | POA: Diagnosis not present

## 2024-02-26 DIAGNOSIS — N35919 Unspecified urethral stricture, male, unspecified site: Secondary | ICD-10-CM | POA: Diagnosis not present

## 2024-02-26 DIAGNOSIS — R339 Retention of urine, unspecified: Secondary | ICD-10-CM | POA: Diagnosis not present

## 2024-03-27 DIAGNOSIS — N35919 Unspecified urethral stricture, male, unspecified site: Secondary | ICD-10-CM | POA: Diagnosis not present

## 2024-03-27 DIAGNOSIS — R339 Retention of urine, unspecified: Secondary | ICD-10-CM | POA: Diagnosis not present

## 2024-04-03 DIAGNOSIS — R972 Elevated prostate specific antigen [PSA]: Secondary | ICD-10-CM | POA: Diagnosis not present

## 2024-04-10 DIAGNOSIS — R972 Elevated prostate specific antigen [PSA]: Secondary | ICD-10-CM | POA: Diagnosis not present

## 2024-04-10 DIAGNOSIS — N312 Flaccid neuropathic bladder, not elsewhere classified: Secondary | ICD-10-CM | POA: Diagnosis not present

## 2024-05-10 DIAGNOSIS — N35919 Unspecified urethral stricture, male, unspecified site: Secondary | ICD-10-CM | POA: Diagnosis not present

## 2024-06-07 DIAGNOSIS — Z634 Disappearance and death of family member: Secondary | ICD-10-CM | POA: Diagnosis not present

## 2024-06-07 DIAGNOSIS — I10 Essential (primary) hypertension: Secondary | ICD-10-CM | POA: Diagnosis not present

## 2024-06-07 DIAGNOSIS — N400 Enlarged prostate without lower urinary tract symptoms: Secondary | ICD-10-CM | POA: Diagnosis not present

## 2024-06-07 DIAGNOSIS — Z23 Encounter for immunization: Secondary | ICD-10-CM | POA: Diagnosis not present

## 2024-06-07 DIAGNOSIS — E7800 Pure hypercholesterolemia, unspecified: Secondary | ICD-10-CM | POA: Diagnosis not present

## 2024-06-07 DIAGNOSIS — R7303 Prediabetes: Secondary | ICD-10-CM | POA: Diagnosis not present

## 2024-06-10 DIAGNOSIS — N35919 Unspecified urethral stricture, male, unspecified site: Secondary | ICD-10-CM | POA: Diagnosis not present

## 2024-06-10 DIAGNOSIS — R339 Retention of urine, unspecified: Secondary | ICD-10-CM | POA: Diagnosis not present
